# Patient Record
Sex: Female | Born: 1975 | Race: White | Hispanic: No | Marital: Single
Health system: Northeastern US, Academic
[De-identification: ages and names within clinical notes are randomized; demographics above are authoritative.]

---

## 2021-03-19 ENCOUNTER — Ambulatory Visit: Admit: 2021-03-19 | Payer: PRIVATE HEALTH INSURANCE | Attending: Registered"

## 2021-03-19 ENCOUNTER — Encounter: Admit: 2021-03-19 | Payer: PRIVATE HEALTH INSURANCE

## 2021-03-20 ENCOUNTER — Encounter: Admit: 2021-03-20 | Payer: PRIVATE HEALTH INSURANCE

## 2021-03-20 DIAGNOSIS — R140 Abdominal distension (gaseous): Secondary | ICD-10-CM

## 2021-03-20 NOTE — Progress Notes
Initial Nutrition Consultation 	VIDEO TELEHEALTH VISIT: This clinician is part of the telehealth program and is conducting this visit in a currently approved location. For this visit the clinician and patient were present via interactive audio & video telecommunications system that permits real-time communications.Patient/parent or guardian consent given for video visit: Yes State patient is located in: [place] clinician is appropriately licensed in the above state to provide care for this visit. Other individuals present during the telehealth encounter and their role/relation: noneIf billing based on time, please complete (Not required if billing based on MDM):                           Total time spent in medical video consultation: 60; Total time spent by the provider on the day of service, which includes time spent on chart review, medical video consultation, education, coordination of care/services and counseling Because this visit was completed over video, a hands-on physical exam was not performed.  Patient/parent or guardian understands and knows to call back if condition changes.Name: Kim Stephenson of Visit: 4/25/2022Provider:   Larita Fife, RDLocation of Visit:  Video;Referred by:  Dr. Joetta Manners (scanned to media) Date of referral: 3/14/22Reason(s) for Referral:   Bloating R14.0Client HistoryPast Medical History:No past medical history on file.States she has high cholesterol; states she has rosacea; Review of referral- hx negative celiac; SIBO, IBSSocial/Personal History including influence of family/friends on diet, supports systems any religious or cultural influences on diet:  Assessed.  Any notable comments as follows:  Self shop/ccooks; 88 year old daughter; son is in Electronics engineer; Physical Activity: started the gym four months ago; was swimming but on pause; walking more; gym three times 30 minutes each time;Weight History and Weight Goal:Wt: No data found for Wt Pt's chart reviewed for nutritionally significant: labs, medical tests, clinical findings, food allergies, and medications (and food/nutrient interactions).  Additional notations as needed:?	Food Allergies/Intolerances:  Allergies-  ; intolerance- bread; redness from alcohol; spicy?	?	Nutritionally Significant Medications:  reviewed- atorvastatin, phentermine?	Nutrition Supplements: multiple vitamin; d3- 2000 mg; gnc ultra is name of multiple, b complex?	Nutrition Related Laboratory Findings: reviewedNutrition focused physical exam:  Well appearingClinical signs and symptoms:  Bloating improved with tx for SIBO; tingling in extremities; bowel movements - loose or some constipation;Urgency- occasionally; Anthropometrics- Weight:	60                                                                                      Height: 190 lb   In MD note 12/27/20; cited as 180 lb by pt today                                                                                                      BMI:  35.1IBW:  126 lb used for calculation purposes, correlate with bmi of 25                                                                                                                         Nutrition Prescription using ynhh outpatient standards based upon activity/age unless otherwise specifiedCalories:  1900 calories if moderate activity Basic healthy diet to meet RDAs for age & genderAssessment of Current Intake and Learning NeedsSummary of dietary intake based upon interview and diet recall as below:  Intake as reported is three meals, random snacks.  States bloating is worse when eating products like bread, pasta; uses plant based milks though does not cite lactose intolerance as reason; - all suggesting that FODMAP diet could be of value.Diet appears low in grains- selectively does this for weight regulation of low carbDoes not enjoy meal planning, cooking tasks and thus when she tried fodmap elimination in the past, found it too confusing and confining. 24-Hour/Typical Day Diet Recall  First Meal Second Meal Third Meal  Snacks Other/food frequency notes Bloating mild in morning;Protein shake- veggies, banana;Beach body protein powder or vita greens;Feels good afterward; Veggies only, fruitOr pizza in air fryer or chicken nuggets; Chicken nuggets- red face;  As described earlier for lunch or salad  Water Dairy- not much; occasionally pudding with almond milkCheese in salad sometimesFruit- 1-2 fruit a few times weekVeggies- highGrains- try to stay away; gluten free bread tried- eats less carbs for weight regulation;Protein- only chicken or beef; no fish or seafood;Cooking fats- extra virgin olive oil; Snack food- popcornSweets- dark chocolate Dove candy      Patient concerns, any pt expressed learning needs  and goals for consult:  Bloating; SIBO; Nutrition Diagnosis:Food and nutrition related knowledge deficit r/t insufficient personalized assessment and education, aeb pt uncertainty about food choices for digestive issues.Nutrition Interventions:Nutrition Education- Content and Application Comprehensive education included explaining purpose of nutrition education as needed, relationship of nutrition to health and disease, recommended modifications and focused on skill development for lifestyle change.-Discussed  food type, amount, and distribution of food  based upon nutrition rx, making recommendations for change based upon usual dietary intake.Nutrition Education:  Content and Application X if discussed  Optimal types of foods and overall diet quality X reviewed fodmap elimination  Portion sizes  Specific nutrients:  Meal/snack timing  Meal balance  Non-hunger eating  Skills Meal planning X extensive ideas for easy meals to employ                               Budgeting                               Grocery shopping x                              Dining  out                               Cooking methods x                              Label reading X avoid high fructose corn syrup, hidden garlic / onion natural flavorings                              Tracking x Other: X supplements- noted is b complex and pt did not have all supplements available.  Unclear if supplement has higher dose niacin, b3 as reports some flushing.   Other:    Physical Activity  Purpose of nutrition education Gabriel Earing that symptoms merit another try at fodmap elimination and then challenges.  Explained concept and as pt agreed to try, reviewed fodmap elimination protocol.  Coordination of Nutrition Care: none required todayPatient Goals to Achieve by Next F/u unless otherwise specified: Send RD list of supplements being usedMinimum of 90 oz water per day Start FODMAP elimination diet next weekKeep a food/symptom logEducation Materials Provided:fodmap materials and summary in mychartUnderstanding, Motivation, Readiness/Barriers to Change and Anticipated Ability to Meet Nutrition Goals.Understanding-engaged and verbalizes understanding of fodmap eliminationMotivations/Strengths-no strong supports; motivated to improve digestive healthChallenges/Barriers to change and anticipated ability to achieve nutrition goals-no barriers though of note is that Quinlee does not want to spend a lot of time on meal planning/cooking as it is not a task she enjoys Monitoring and Evaluation for Follow Up:Follow Up Time Frame:  1 monthWill monitor:  Weight, food and fluid/beverage intake, goal adherence, digestive system symptoms, level of knowledge, vitamin/mineral supplement use  Start/end time:  10:55 am to 11:55 am Electronically Signed by Larita Fife, RD, March 19, 2021

## 2021-04-26 ENCOUNTER — Encounter: Admit: 2021-04-26 | Payer: PRIVATE HEALTH INSURANCE | Attending: Registered"

## 2021-04-26 ENCOUNTER — Encounter: Admit: 2021-04-26 | Payer: MEDICAID | Attending: Registered"

## 2021-04-26 NOTE — Progress Notes
6/2/2022Pt did not connect for her planned 11:15 am appointment.  Called pt at 11:30 am and she had the time confused but she was ultimately not able to access zoom due to technical problems on her end.  Thus a visit was not completed and she was rescheduled.Notes that she has found some symptom relief with fodmap elimination diet. But has hard time doing diet as is different from that of her family.  For instance, makes breaded veggies for all and stated that making for self is too time consuming.  Also was confused about veggies and carbs stating broccoli had 30 gm carb whereas 1/2 cup is 5 gm carb.Very briefly suggested lower fodmap veggies of green beans and steamed vs breaded.Noted by pt is frustration with weight and agreed to address at follow up.Electronically Signed by Larita Fife, RD, April 26, 2021

## 2021-05-07 NOTE — Progress Notes
Pt did not arrive for visit.Electronically Signed by Larita Fife, RD, May 07, 2021

## 2021-05-16 ENCOUNTER — Ambulatory Visit: Admit: 2021-05-16 | Payer: MEDICAID | Attending: Registered"

## 2021-05-16 ENCOUNTER — Encounter: Admit: 2021-05-16 | Payer: PRIVATE HEALTH INSURANCE

## 2021-05-16 NOTE — Progress Notes
Return Nutrition Consultation 	VIDEO TELEHEALTH VISIT: This clinician is part of the telehealth program and is conducting this visit in a currently approved location. For this visit the clinician and patient were present via interactive audio & video telecommunications system that permits real-time communications.Patient/parent or guardian consent given for video visit: Yes State patient is located in: [place]The clinician is appropriately licensed in the above state to provide care for this visit. Other individuals present during the telehealth encounter and their role/relation: noneIf billing based on time, please complete (Not required if billing based on MDM):                           Total time spent in medical video consultation: 25; Total time spent by the provider on the day of service, which includes time spent on chart review, medical video consultation, education, coordination of care/services and counseling Because this visit was completed over video, a hands-on physical exam was not performed.  Patient/parent or guardian understands and knows to call back if condition changes.Name: Kim Stephenson of Visit: 6/22/2022Provider:   Larita Fife, RDLocation of Visit:  Video;Referred by:  Dr. Joetta Manners (scanned to media) Date of referral: 3/14/22Reason(s) for Referral:   Bloating R14.0Client HistoryPast Medical History:No past medical history on file.States she has high cholesterol; states she has rosacea; Review of referral- hx negative celiac; SIBO, IBSSocial/Personal History including influence of family/friends on diet, supports systems any religious or cultural influences on diet:  Assessed.  Any notable comments as follows:  Self shop/ccooks; 44 year old daughter; son is in army; 05/16/2021- plans on moving to [facility] over the summer;Physical Activity: started the gym four months ago; was swimming but on pause; walking more; gym three times 30 minutes each time;Weight History and Weight Goal:Wt: No data found for Wt Pt's chart reviewed for nutritionally significant: labs, medical tests, clinical findings, food allergies, and medications (and food/nutrient interactions).  Additional notations as needed:?	Food Allergies/Intolerances:  Allergies-  ; intolerance- bread; redness from alcohol; spicy; blackberries; ?	Spicy, cabbage- face gets red?	Nutritionally Significant Medications:  reviewed- atorvastatin, phentermine?	Nutrition Supplements: multiple vitamin; d3- 2000 mg; gnc ultra is name of multiple, b complex?	Nutrition Related Laboratory Findings: reviewedNutrition focused physical exam:  Well appearingClinical signs and symptoms:  Bloating improved with tx for SIBO; tingling in extremities; bowel movements - loose or some constipation;Urgency- occasionally;  Anthropometrics- Weight:	60                                                                                      Height: 190 lb   In MD note 12/27/20; cited as 180 lb by at initial consult; 05/16/2021- no updates  BMI:        35.1IBW:  126 lb used for calculation purposes, correlate with bmi of 25                                                                                                                         Nutrition Prescription using ynhh outpatient standards based upon activity/age unless otherwise specifiedCalories:  1900 calories if moderate activity Basic healthy diet to meet RDAs for age & genderAssessment of Current Intake and Learning NeedsInitial consult was 03/19/21.RD and pt agreed to trial of low fodmap diet/elimiantion diet given hx sibo and symptoms of IBSNotable is pt disliking meal planning; Diet recall difficult for pt as has no routine;Diet recall suggests intake of protein is lower than ideal.Feels she did not lose weight but also states that bloating influences her perceptions of her weight/body fat massDigestively states she feels better some of the time but cannot find any specific trends.  Notes foods in allergy list to be not tolerated.Discussion and limited diet recall suggest that she has not implemented the fodmap elimination or low fodmap diet consistently.  Has limited time as notes packing as [place] [facility] by end of July.24-Hour/Typical Day Diet Recall- initial visit and updates 05/16/2021  First Meal Second Meal Third Meal  Snacks Other/food frequency notes Bloating mild in morning;Protein shake- veggies, banana;Beach body protein powder or vita greens;Feels good afterward;6/22/2022ELEVATION shake  Veggies only, fruitOr pizza in air fryer or chicken nuggets; Chicken nuggets- red face; 05/16/2021 Random; sandwiches- regular bread As described earlier for lunch or salad6/22/2022Random again- no routine;  Water Dairy- not much; occasionally pudding with almond milkCheese in salad sometimesFruit- 1-2 fruit a few times weekVeggies- highGrains- try to stay away; gluten free bread tried- eats less carbs for weight regulation;Protein- only chicken or beef; no fish or seafood;Cooking fats- extra virgin olive oil; Snack food- popcornSweets- dark chocolate Dove candy      Patient concerns, any pt expressed learning needs  and goals for consult:  Bloating; SIBO; Nutrition Diagnosis:Food and nutrition related knowledge deficit r/t insufficient personalized assessment and education, aeb pt uncertainty about food choices for digestive issues.- ongoing Nutrition Interventions:Nutrition Education- Content and Application Comprehensive education included explaining purpose of nutrition education as needed, relationship of nutrition to health and disease, recommended modifications and focused on skill development for lifestyle change.-Discussed  food type, amount, and distribution of food  based upon nutrition rx, making recommendations for change based upon usual dietary intake.Nutrition Education:  Content and Application X if discussed 03/19/21 X if discussed 05/16/2021  Optimal types of foods and overall diet quality X reviewed fodmap elimination  X low fodmap food choices discussed again.  Easy to access, assemble ones highlighted easy proteins, fruit/veggie on low fodmap list, trying to use brown rice or potato and keeping processed foods to a minimum to simplify need to read complicated ingredient lists etc. Portion sizes   Specific nutrients:   Meal/snack timing   Meal balance  Non-hunger eating   Skills                                 Meal planning X extensive ideas for easy meals to employ                                Budgeting                                Grocery shopping x                               Dining out                                Cooking methods x                               Label reading X avoid high fructose corn syrup, hidden garlic / onion natural flavorings                               Tracking x X benefits  Other: X supplements- noted is b complex and pt did not have all supplements available.  Unclear if supplement has higher dose niacin, b3 as reports some flushing.   X did not send her supplements  Other:     Physical Activity   Purpose of nutrition education Gabriel Earing that symptoms merit another try at fodmap elimination and then challenges.  Explained concept and as pt agreed to try, reviewed fodmap elimination protocol.  X revised plan of action Coordination of Nutrition Care: none required todayPatient Goals to Achieve by Next F/u unless otherwise specified: From initial consultSend RD list of supplements being used- not sentMinimum of 90 oz water per day- not done Start FODMAP elimination diet next week- not done; reviewe low fodmapKeep a food/symptom log- not done6/22/2022Send RD list of supplementsKeep food and symptom journalReview the low fodmap diet being sent Education Materials Provided:Low fodmap diet and food/symptom journalReminder of goals Understanding, Motivation, Readiness/Barriers to Change and Anticipated Ability to Meet Nutrition Goals.Understanding- needs additional time to learn low fodamp dietMotivations/Strengths-no strong supports; motivated to improve digestive healthChallenges/Barriers to change and anticipated ability to achieve nutrition goals-time constraints, limits in interest for meal planning/prep Monitoring and Evaluation for Follow Up:Follow Up Time Frame:  1 monthWill monitor:  Weight, food and fluid/beverage intake, goal adherence, digestive system symptoms, level of knowledge, vitamin/mineral supplement use  Start/end time:  10:20 to 10:45 amElectronically Signed by Larita Fife, RD, May 16, 2021

## 2021-05-17 DIAGNOSIS — R140 Abdominal distension (gaseous): Secondary | ICD-10-CM

## 2021-06-12 ENCOUNTER — Ambulatory Visit: Admit: 2021-06-12 | Payer: MEDICAID | Attending: Registered"

## 2022-10-04 IMAGING — DX HIP BILATERAL WITH PELVIS 5 VIEWS
1 series · 5 of 5 positions shown · non-contrast
Comparison: None.

________________________________________________________________________________________________ 
HIP BILATERAL WITH PELVIS 5 VIEWS, 10/04/2022 [DATE]: 
CLINICAL INDICATION: Pain in the left hip, evaluate for sacroiliitis..

[Series 1: AP · U · 0.14mm/px · 5 of 5 slices shown]
[im 1/5]
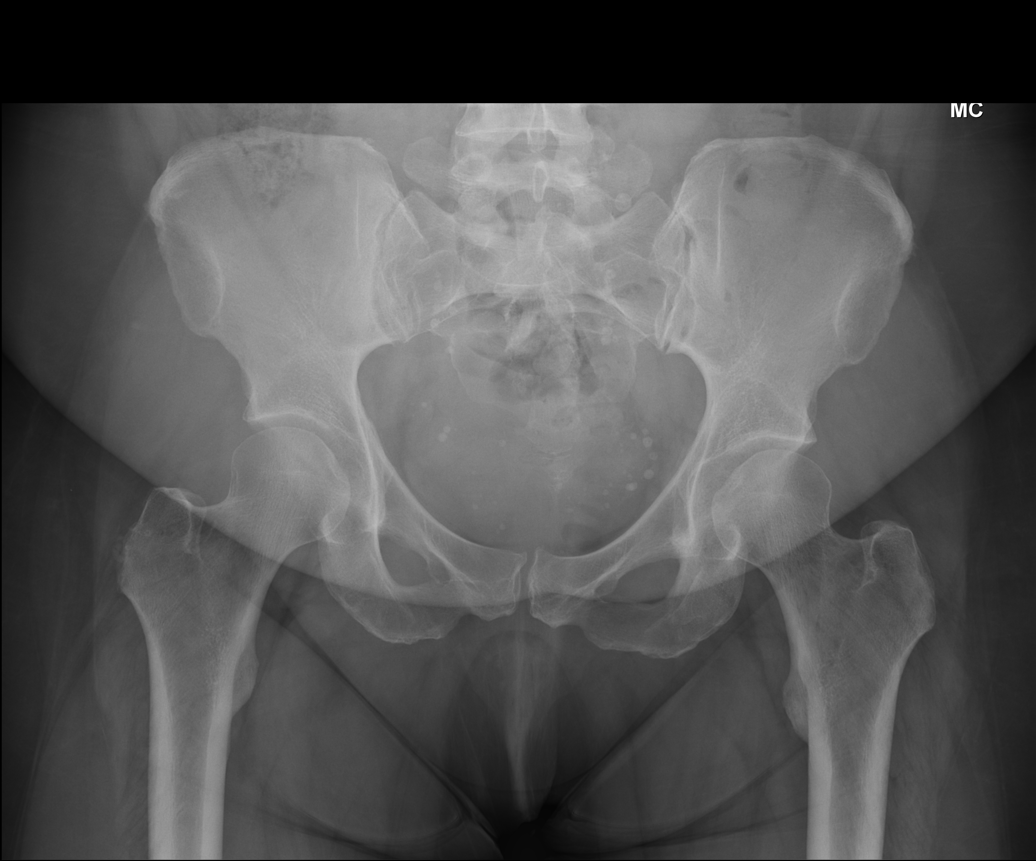
[im 2/5]
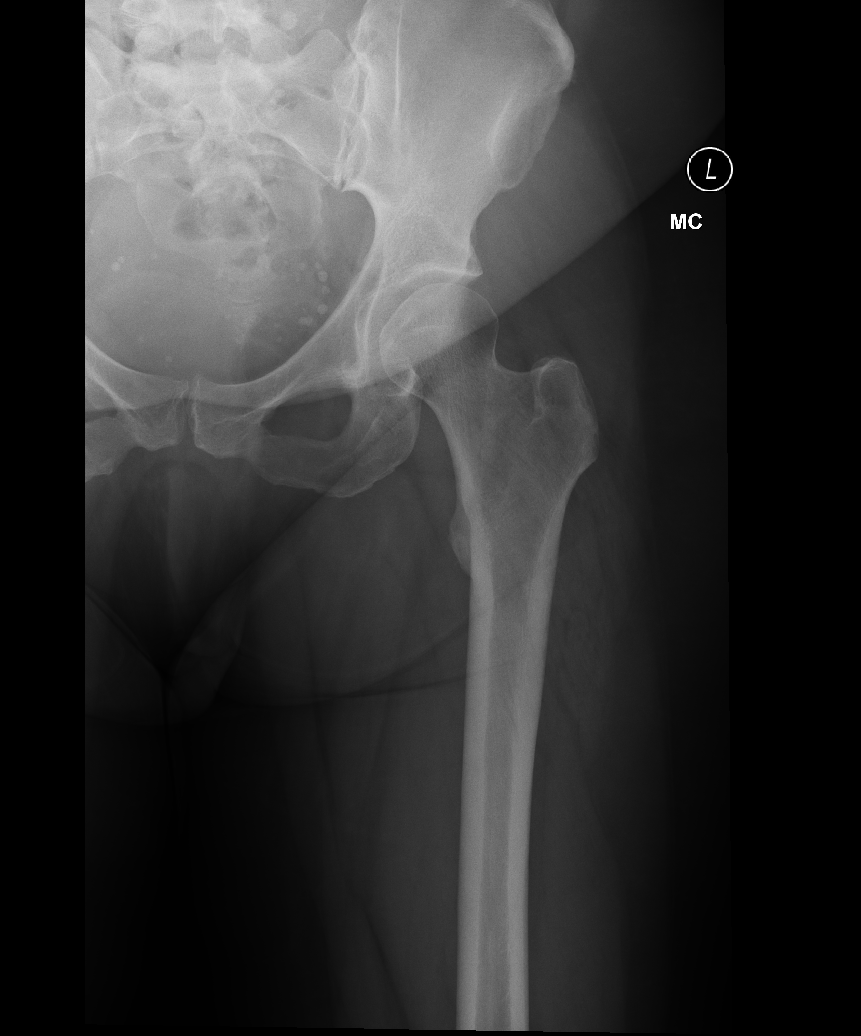
[im 3/5]
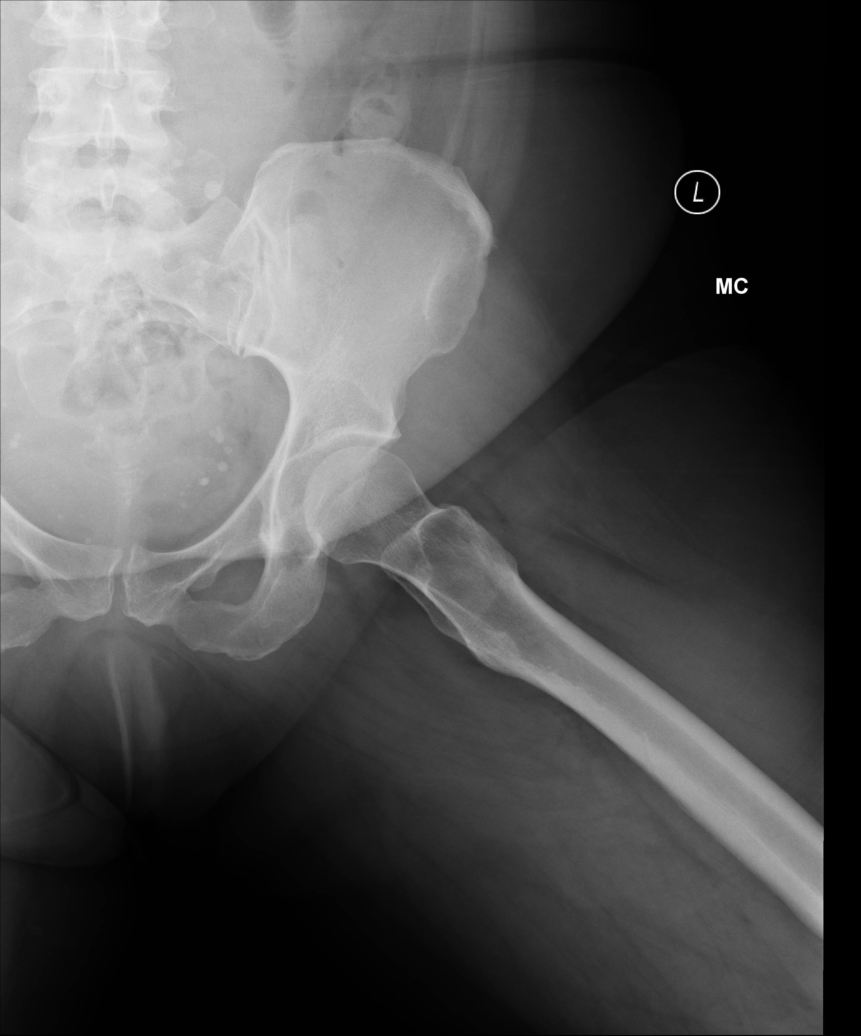
[im 4/5]
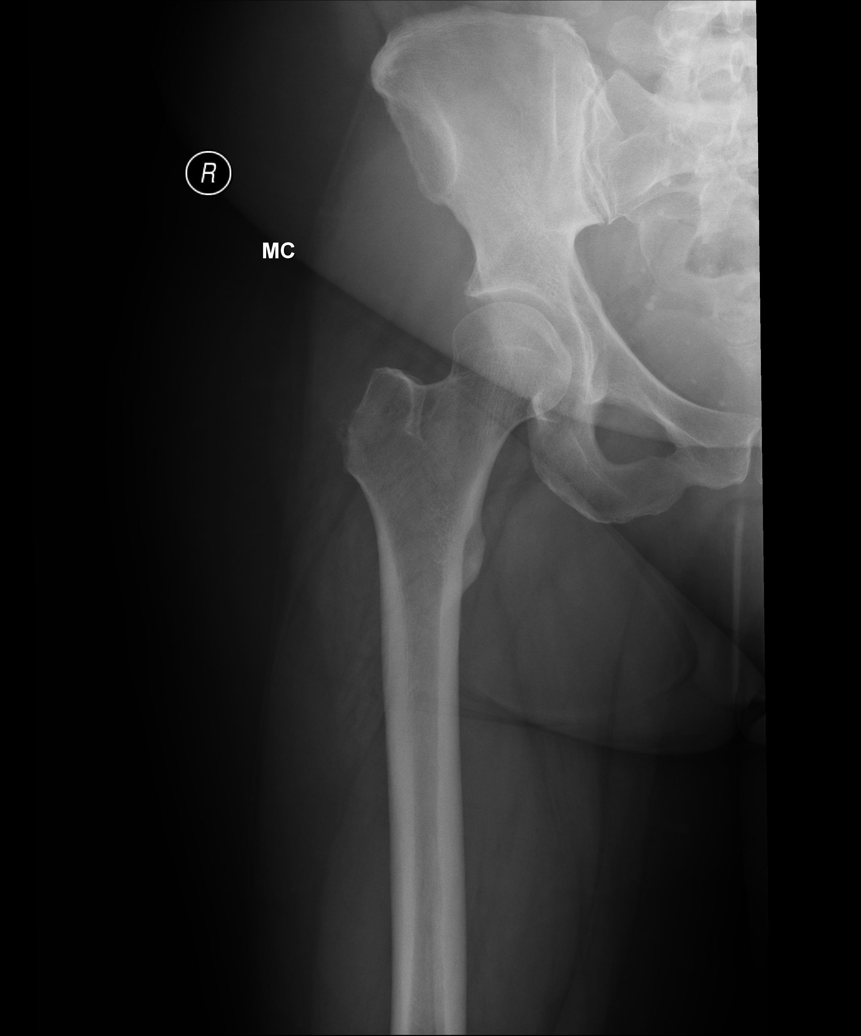
[im 5/5]
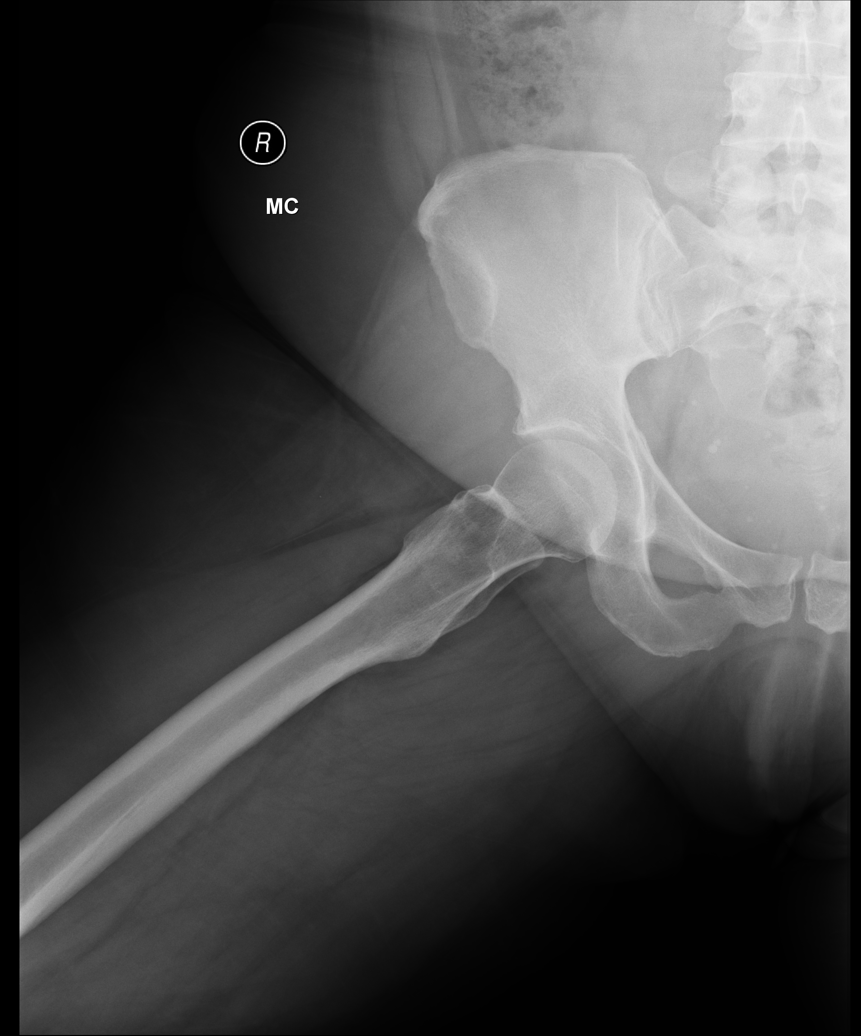

[5 of 5 positions shown; findings below may reference images not displayed]

FINDINGS: No fracture. Normal alignment. Hip joint spaces appear preserved. 
Pelvic phleboliths. There is an indeterminate calcification adjacent to the left 
L5 transverse process measuring 6 mm.
IMPRESSION: 1.  No acute osseous abnormality. 
2.  Indeterminate calculation adjacent to the left L5 transverse process. 
Consideration could be made for CT exam to exclude ureteral stone.

## 2023-03-05 IMAGING — CT CT PELVIS WITHOUT CONTRAST
2 of 3 series · 15 of 46 positions shown, 17 images · non-contrast
Comparison: 09/03/2022 radiographs

________________________________________________________________________________________________ 
CT PELVIS WITHOUT CONTRAST, 03/05/2023 [DATE]: 
CLINICAL INDICATION:  Pain in hips 
A search for DICOM formatted images was conducted for prior CT imaging studies 
completed at a non-affiliated media free facility.
TECHNIQUE: The pelvis was scanned without contrast on a high-resolution CT 
scanner using dose reduction techniques. Routine MPR reconstructions were 
performed. Count of known CT and Cardiac Nuclear Medicine studies performed in 
the previous 12 months = 0.

[Series 2: axial · axial · 0.84mm/px · z∈[-258,-40]mm · 12 of 167 slices shown, 14 images]
[im 11/167  soft-tissue]
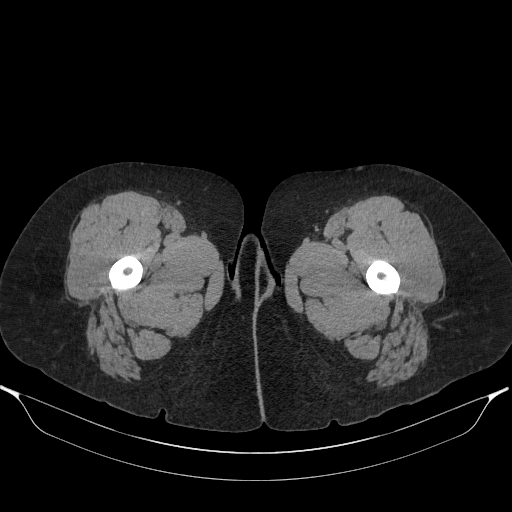
[im 11/167  bone]
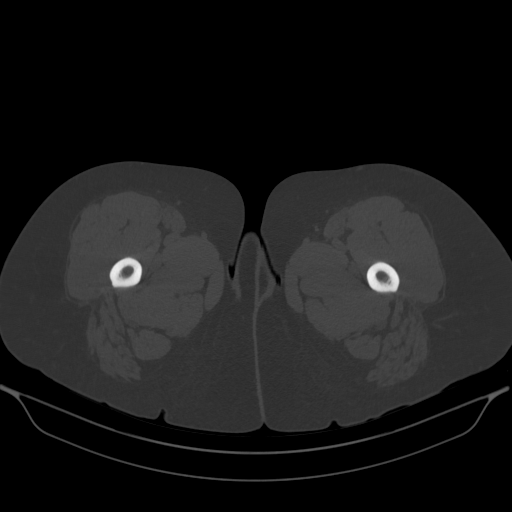
[im 22/167  soft-tissue]
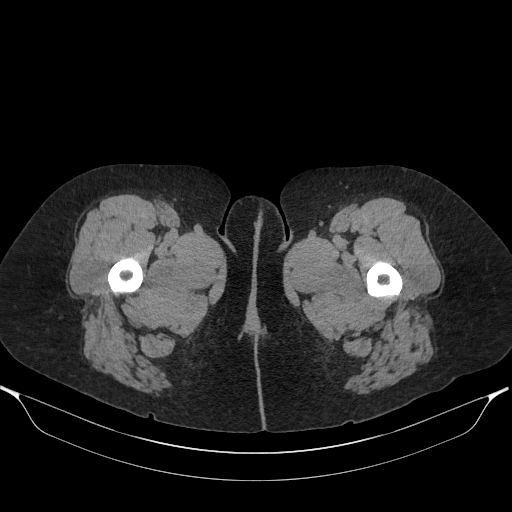
[im 38/167  soft-tissue]
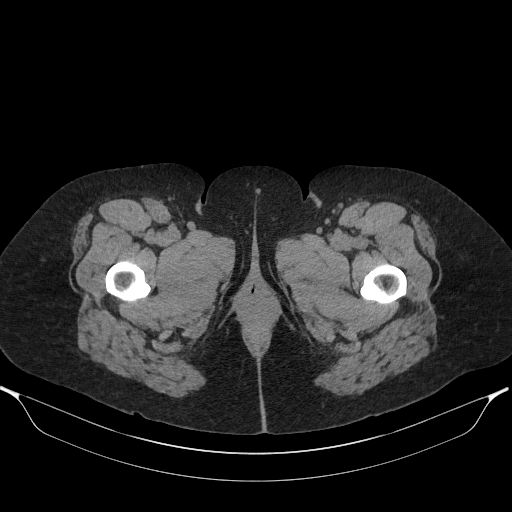
[im 49/167  soft-tissue]
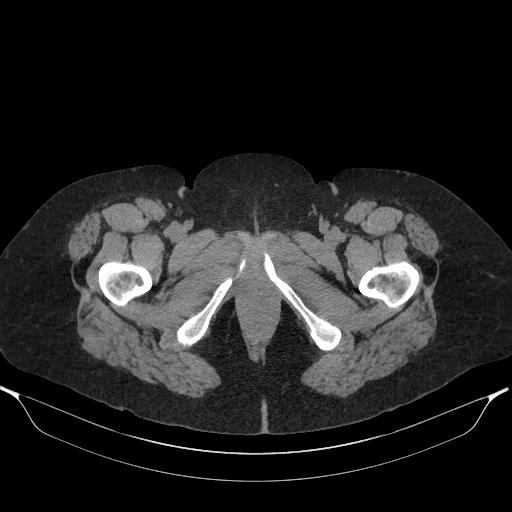
[im 65/167  soft-tissue]
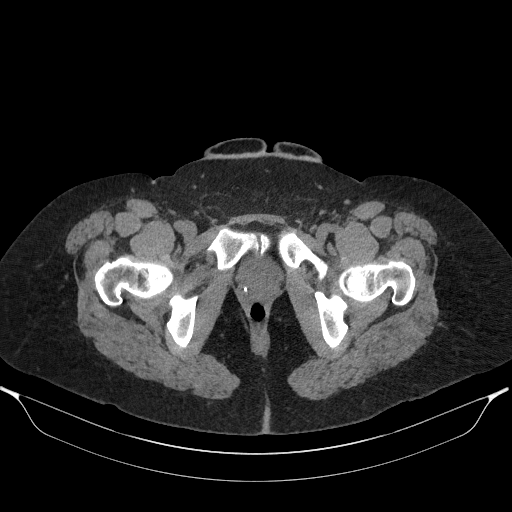
[im 75/167  soft-tissue]
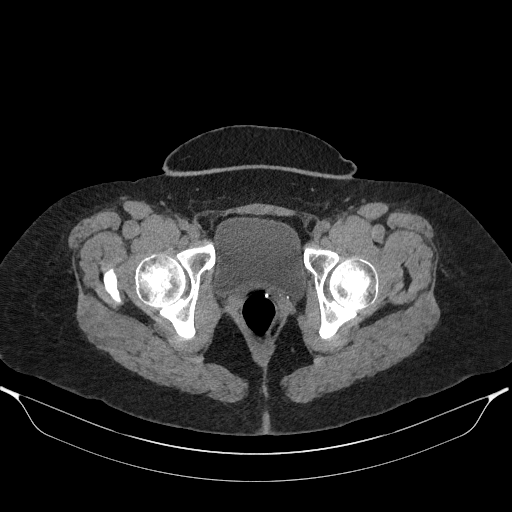
[im 92/167  soft-tissue]
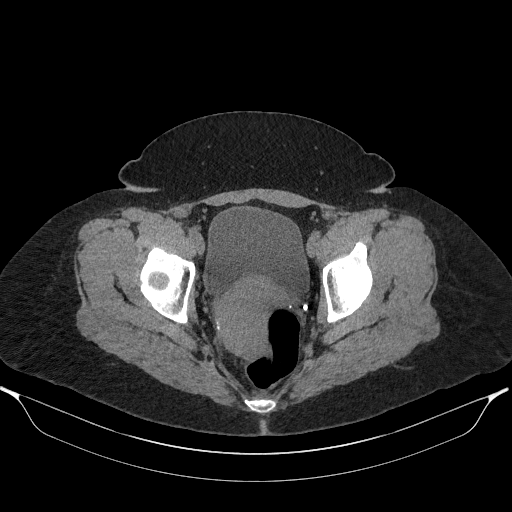
[im 102/167  soft-tissue]
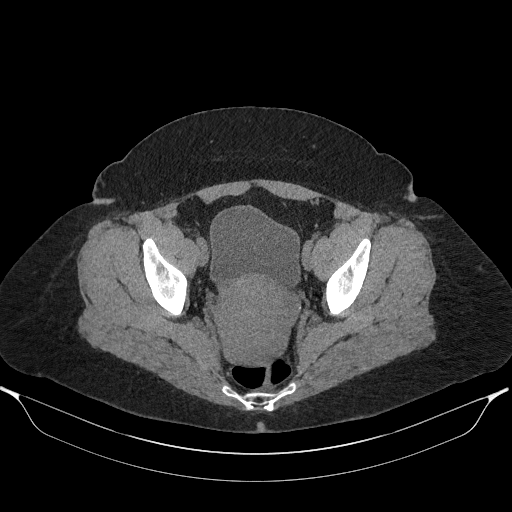
[im 118/167  soft-tissue]
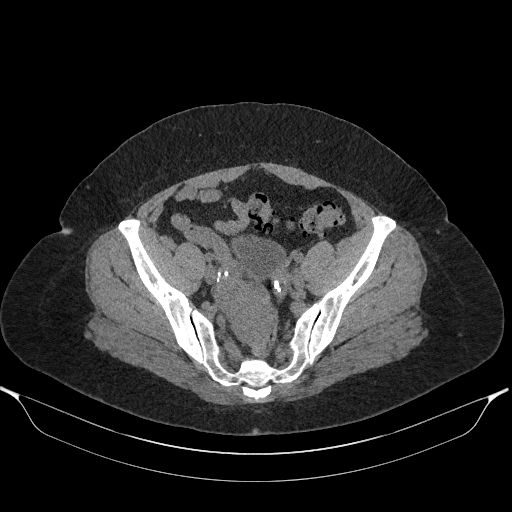
[im 118/167  bone]
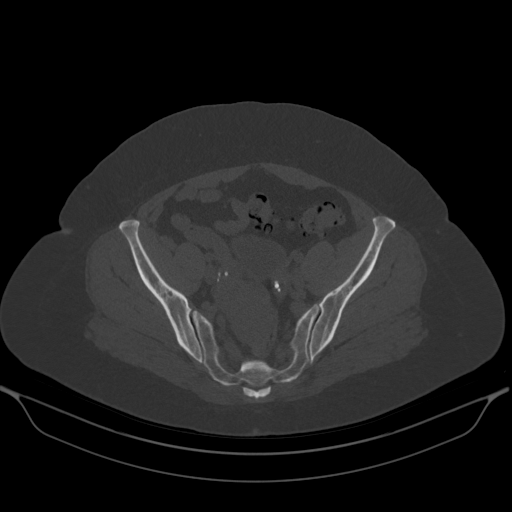
[im 129/167  soft-tissue]
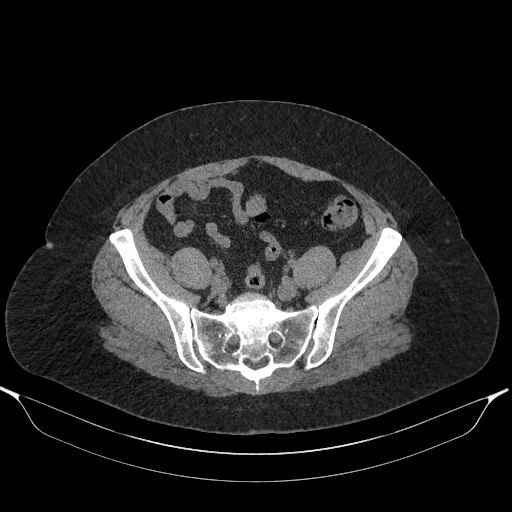
[im 145/167  soft-tissue]
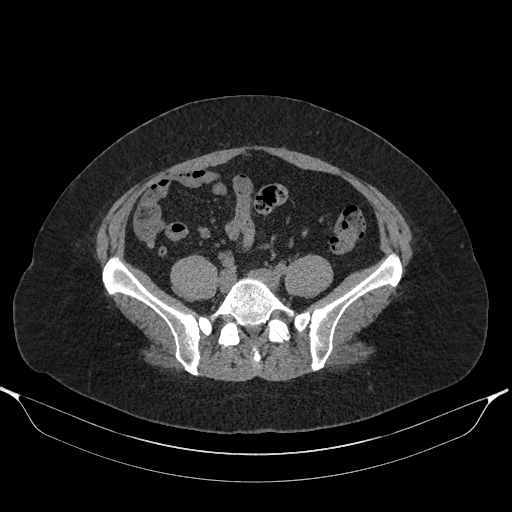
[im 156/167  soft-tissue]
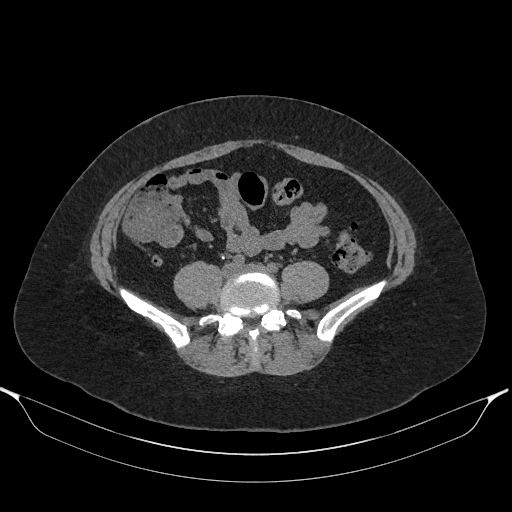

[Series 4: cor · coronal · 0.54mm/px · 3 of 139 slices shown]
[im 47/139  soft-tissue]
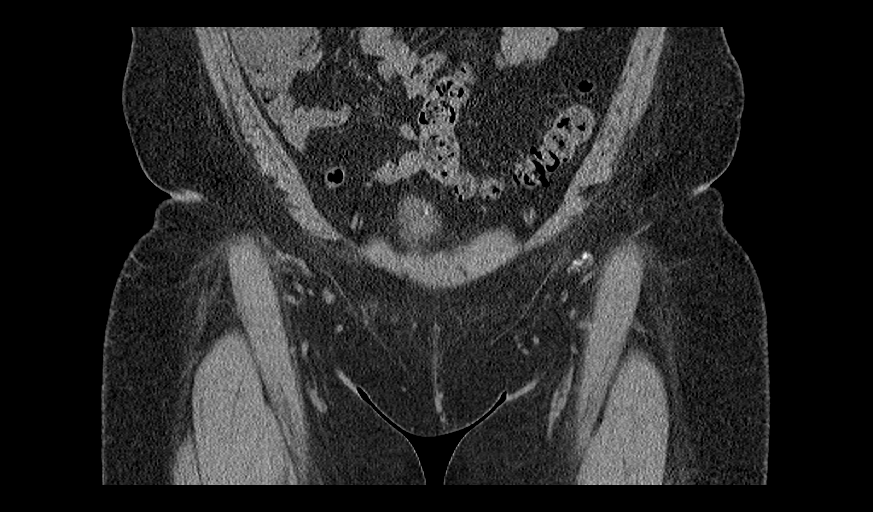
[im 62/139  soft-tissue]
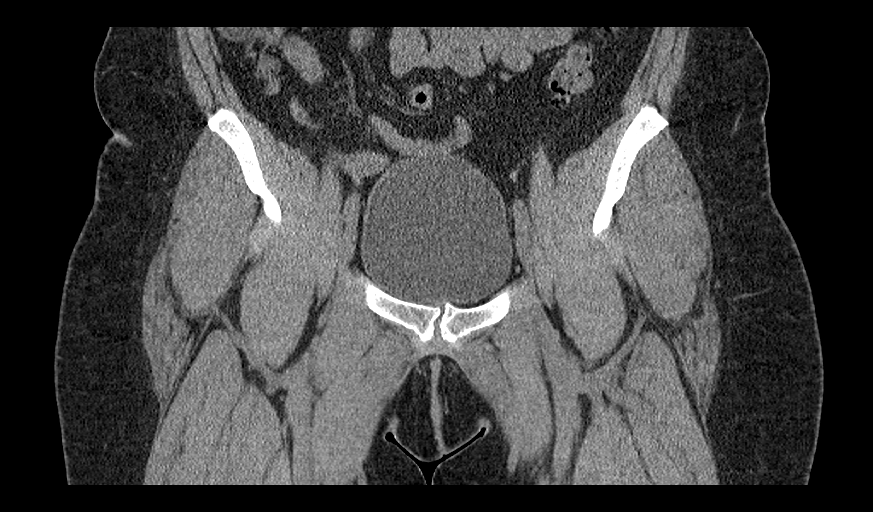
[im 77/139  soft-tissue]
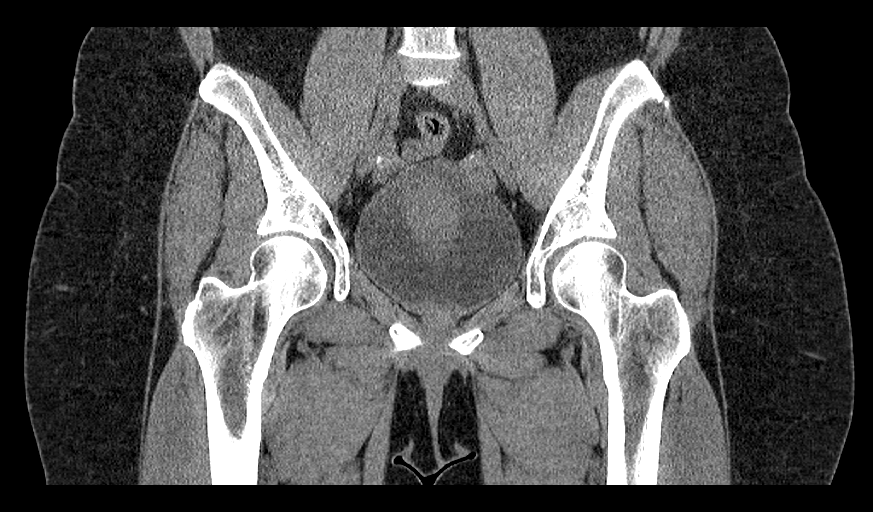

[15 of 46 positions shown; findings below may reference images not displayed]

FINDINGS: HIPS: Hip joints are preserved. Both femoral heads maintain a spherical 
configuration without evidence of avascular necrosis or subarticular collapse. 
No abnormal morphology of the proximal femurs or acetabulum to predispose to 
impingement. 
BONES: No fracture or marrow replacing lesion.  
SI JOINTS AND PUBIC SYMPHYSIS: Mild degenerative change. 
SPINE: Mild L5-S1 degenerative change. 
SOFT TISSUES: Minimal right distal gluteal calcific tendinosis. No mass or fluid 
collection. Retroverted uterus. Colonic diverticulosis. Normal appendix. 
Unenhanced bladder is unremarkable. No adenopathy or free fluid. Stable 
bilateral pelvic phleboliths, with the largest measuring 0.6 cm on the left 
(corresponding to radiographs).
IMPRESSION: 1.  Hip joints are preserved. 
2.  Minimal right distal gluteal calcific tendinosis.  
3.  Mild degenerative change of the pubic symphysis/SI joints/spine. 
4.  Colonic diverticulosis.  
RADIATION DOSE REDUCTION: All CT scans are performed using radiation dose 
reduction techniques, when applicable.  Technical factors are evaluated and 
adjusted to ensure appropriate moderation of exposure.  Automated dose 
management technology is applied to adjust the radiation doses to minimize 
exposure while achieving diagnostic quality images.

## 2023-03-05 IMAGING — CT CT CALCIUM SCORING
1 series · 15 of 20 positions shown, 19 images · non-contrast
Comparison: There are no previous exams available for comparison.

________________________________________________________________________________________________ 
CT CALCIUM SCORING, 03/05/2023 [DATE]:
INDICATION: Peripheral Vascular Disease, Unspecified

[Series 2: calscore · axial · 0.40mm/px · z∈[-245,-124]mm · 15 of 91 slices shown, 19 images]
[im 5/91  vessel]
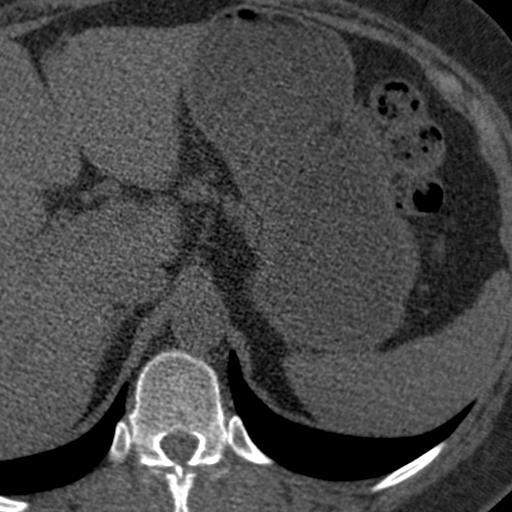
[im 5/91  lung]
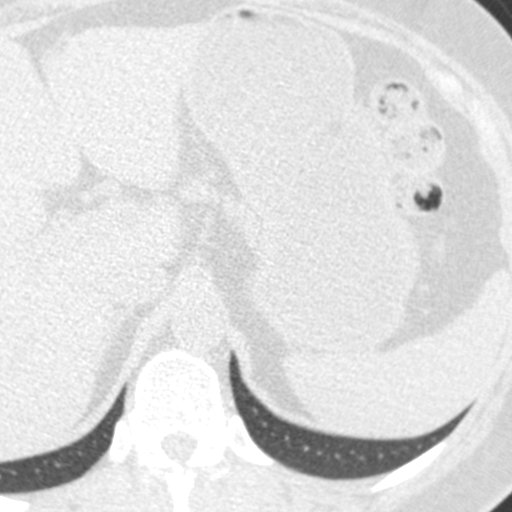
[im 10/91  vessel]
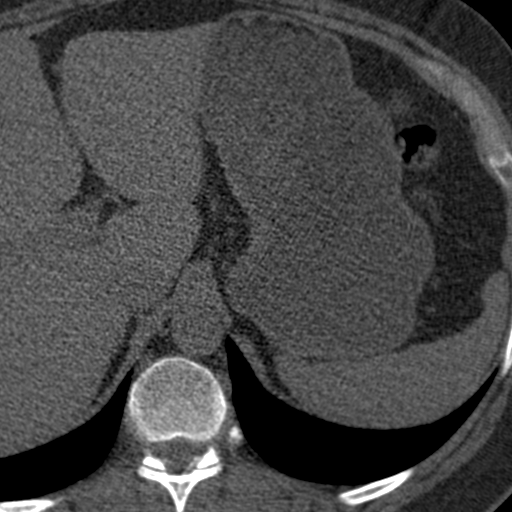
[im 19/91  vessel]
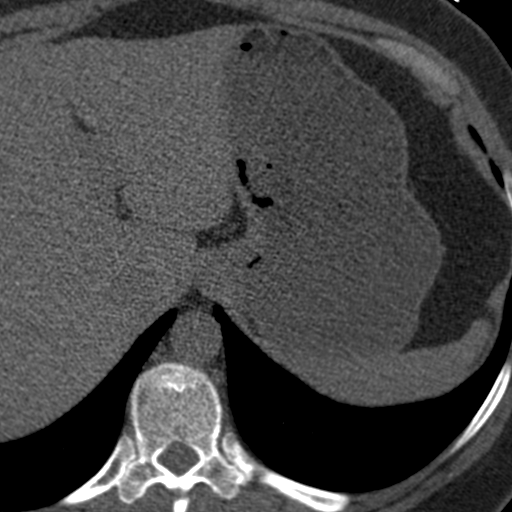
[im 24/91  vessel]
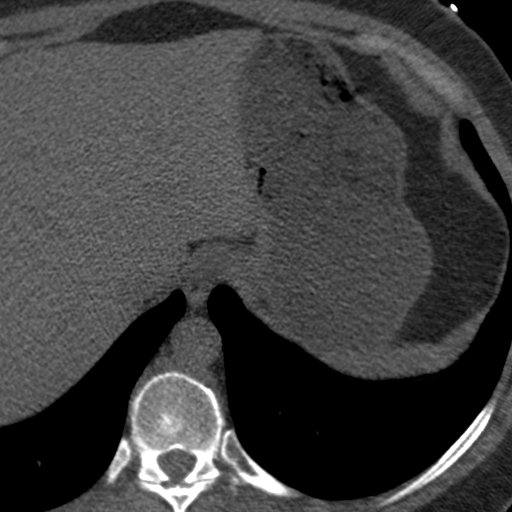
[im 29/91  vessel]
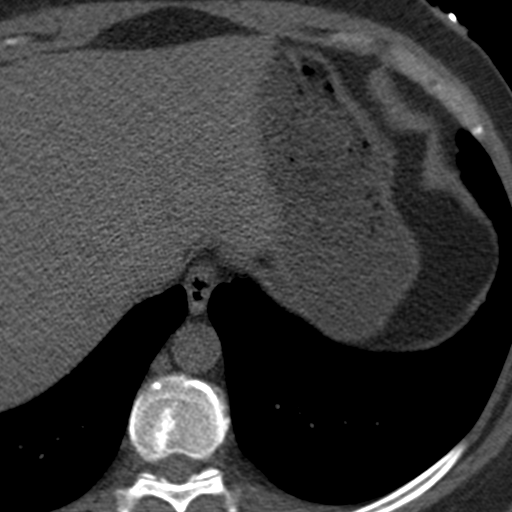
[im 29/91  lung]
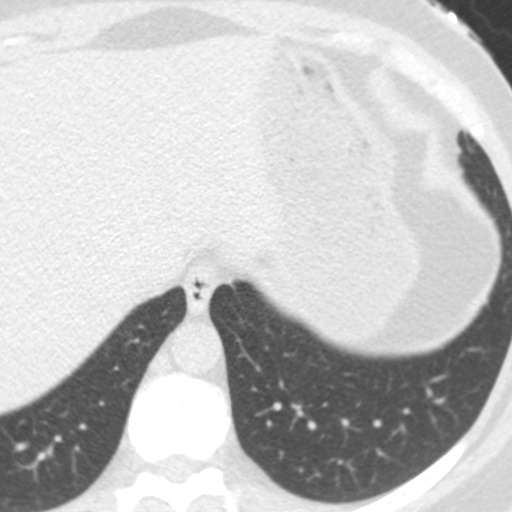
[im 34/91  vessel]
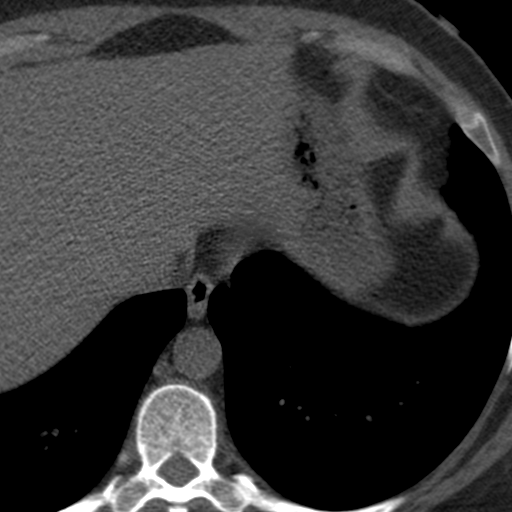
[im 38/91  vessel]
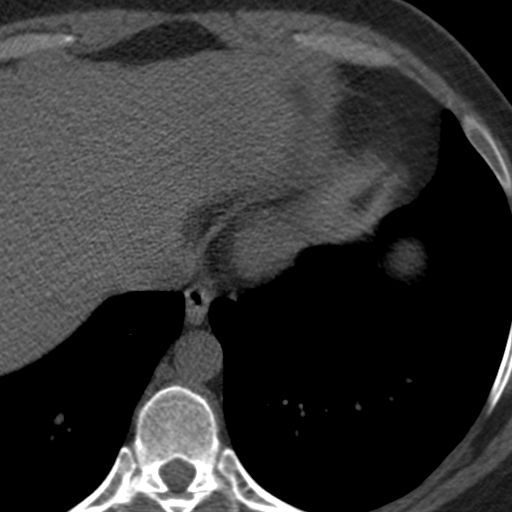
[im 48/91  vessel]
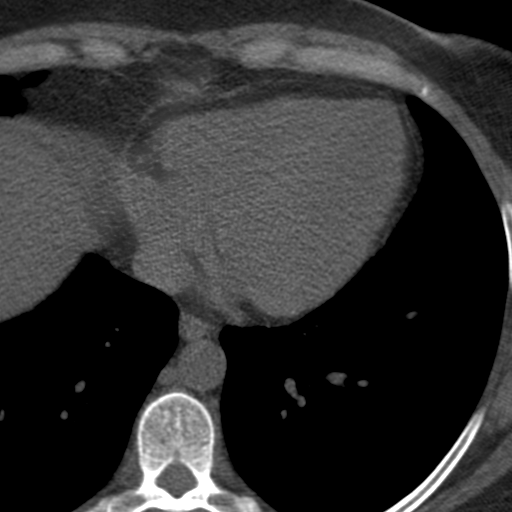
[im 53/91  vessel]
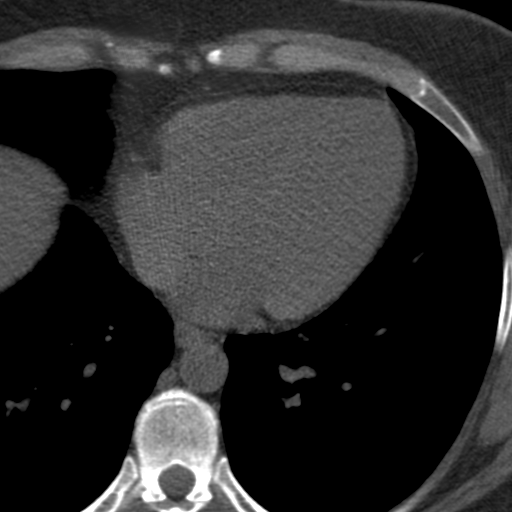
[im 53/91  lung]
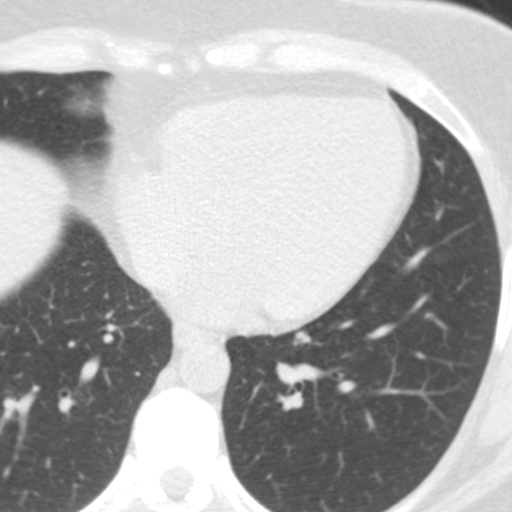
[im 57/91  vessel]
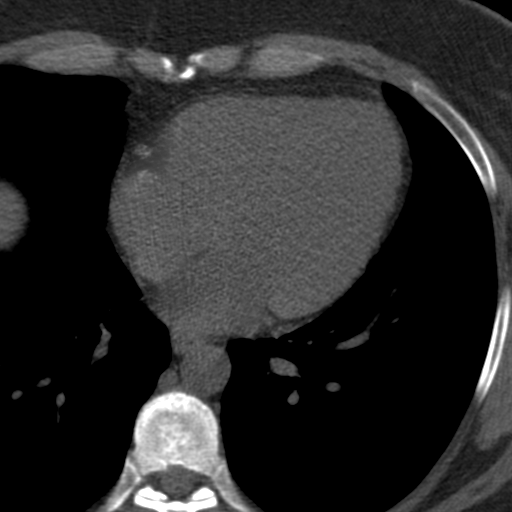
[im 62/91  vessel]
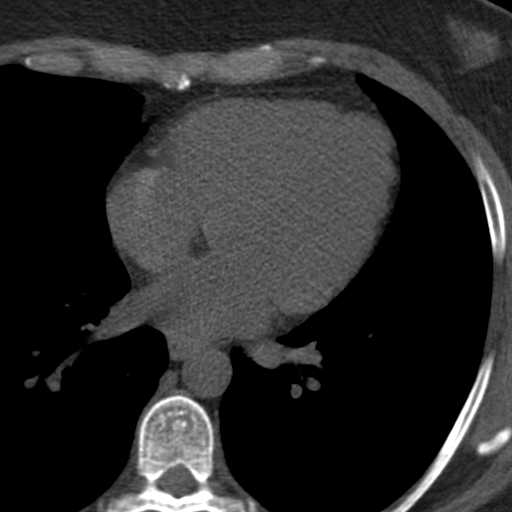
[im 67/91  vessel]
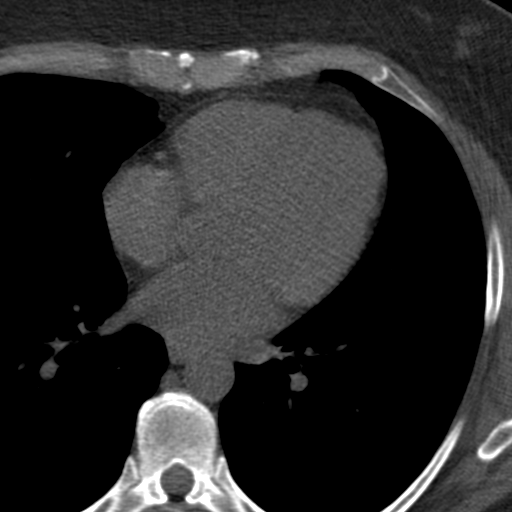
[im 76/91  vessel]
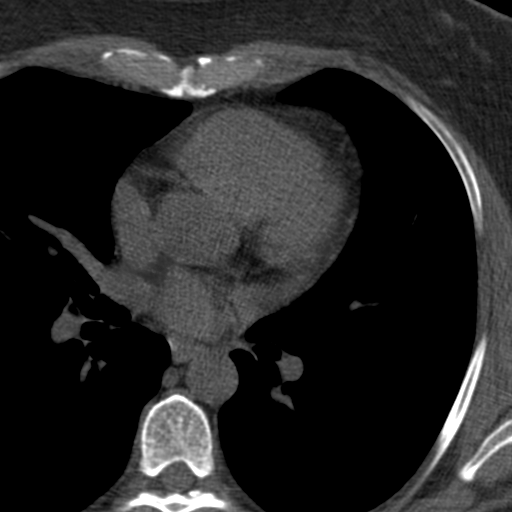
[im 76/91  lung]
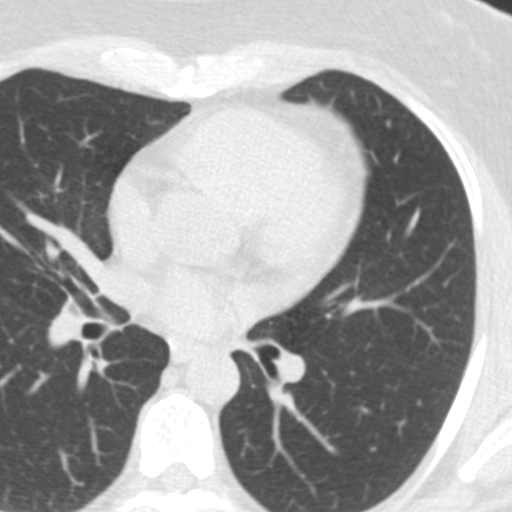
[im 81/91  vessel]
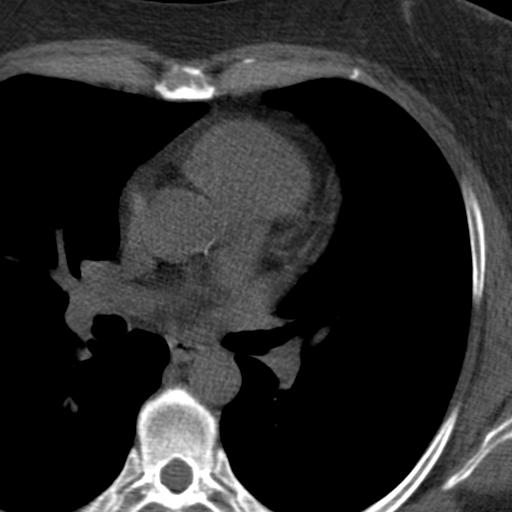
[im 86/91  vessel]
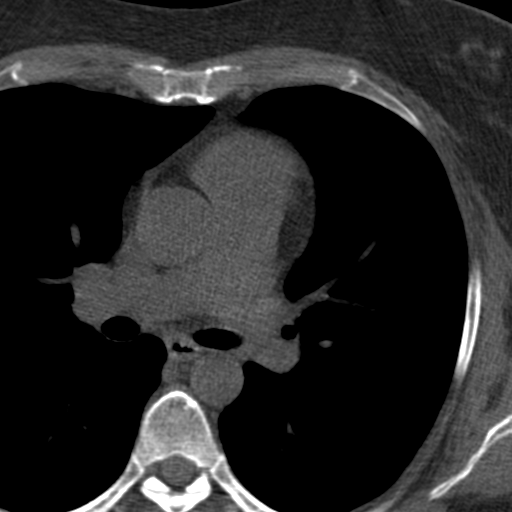

[15 of 20 positions shown; findings below may reference images not displayed]

Count of known 
CT and Cardiac Nuclear Medicine studies performed in the previous 12 months = 0.
FINDINGS: Visualized lungs are clear.
IMPRESSION: Left Main (LM) Score: 0 
Left Anterior Descending Artery (LAD) Score: 0 
Circumflex (CM) Score: 0 
Right Coronary Artery (RCA) Score: 0 
Posterior Descending Artery (PDA) Score: 0 
Total Calcium Score: 0 
Calcium score                                     Presence of Plaque 
0                                                    No evidence of plaque 
1-10                                               Minimal evidence of plaque 
11-100                                            Mild evidence of plaque 
101-400                                          Moderate evidence of plaque 
Over 400                                        Extensive evidence of plaque     

Consider further evaluation if multi-vessel or left-main predominant disease is 
present. 
Calcium scoring sheets to follow. 
RADIATION DOSE REDUCTION: All CT scans are performed using radiation dose 
reduction techniques, when applicable.  Technical factors are evaluated and 
adjusted to ensure appropriate moderation of exposure.  Automated dose 
management technology is applied to adjust the radiation doses to minimize 
exposure while achieving diagnostic quality images.

## 2023-03-15 IMAGING — MR MRI BRAIN WITHOUT CONTRAST
7 of 11 series · 23 of 48 positions shown · IV contrast (gadolinium)
Comparison: None

________________________________________________________________________________________________ 
MRI BRAIN WITHOUT CONTRAST, 03/15/2023 [DATE]: 
CLINICAL INDICATION: Short-term memory loss with vision and hearing changes over 
the past 6 months.
TECHNIQUE: Multiplanar, multiecho position MR images of the brain were performed 
without intravenous gadolinium enhancement. Patient was scanned on a
magnet.

[Series 102: mpr - smartbrain · axial · 1.1mm · 1.09mm/px · z∈[+0,+174]mm · 2 of 2 slices shown]
[im 1/2]
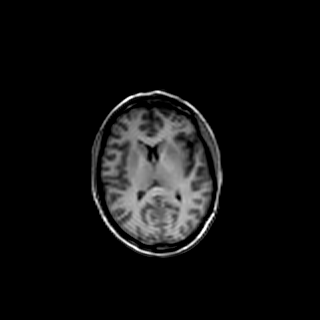
[im 2/2]
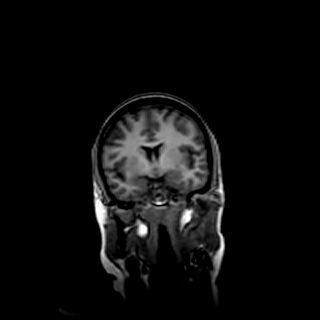

[Series 203: dadc map · axial · 5.0mm · 1.00mm/px · z∈[-102,+52]mm · 2 of 27 slices shown (1 of 2)]
[im 1/27]
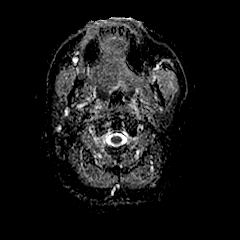
[im 27/27]
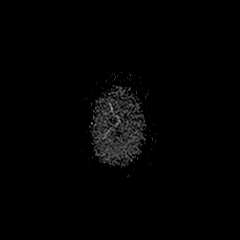

[Series 204: (id) · axial · 5.0mm · 1.00mm/px · z∈[-102,+52]mm · 2 of 27 slices shown (1 of 2)]
[im 1/27]
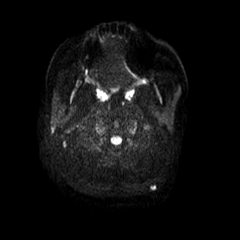
[im 27/27]
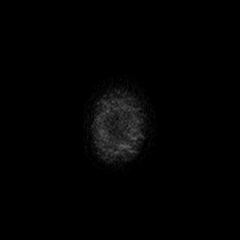

[Series 303: dadc map · coronal · 5.0mm · 0.81mm/px · 2 of 29 slices shown (2 of 2)]
[im 1/29]
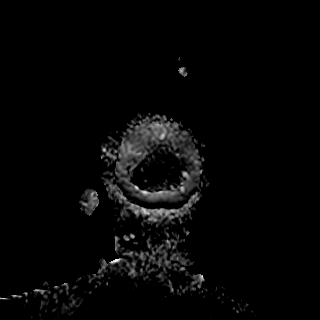
[im 29/29]
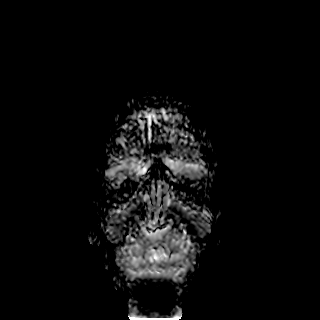

[Series 304: (id) · coronal · 5.0mm · 0.81mm/px · 2 of 29 slices shown (2 of 2)]
[im 1/29]
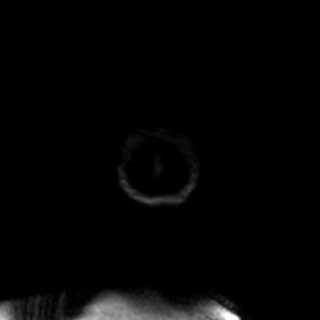
[im 29/29]
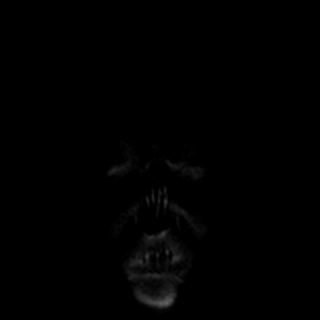

[Series 401: t1_se_sag · sagittal · 4.0mm · 0.43mm/px · 2 of 28 slices shown]
[im 1/28]
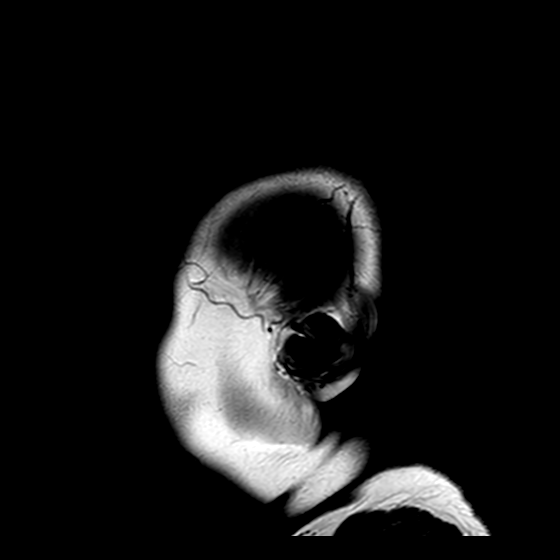
[im 28/28]
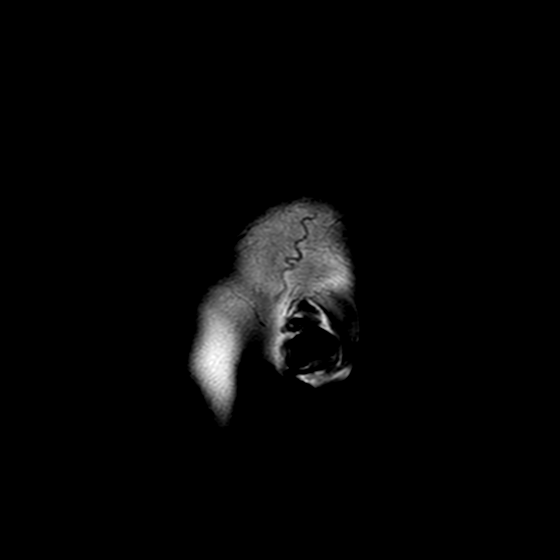

[Series 601: SWI · axial · 3.0mm · 0.40mm/px · z∈[-87,+42]mm · 11 of 200 slices shown]
[im 13/200]
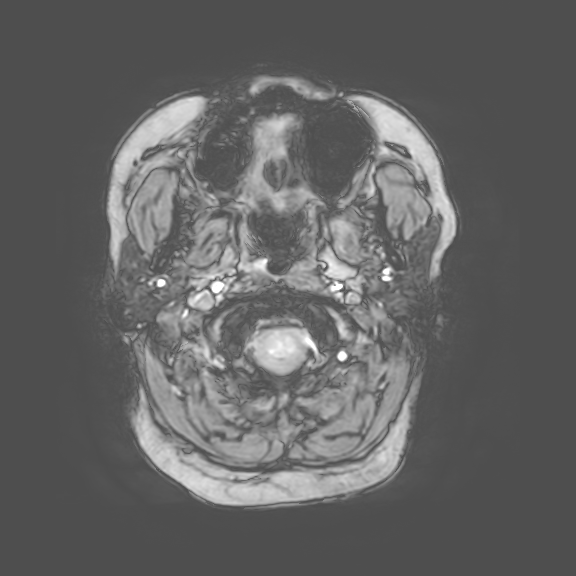
[im 25/200]
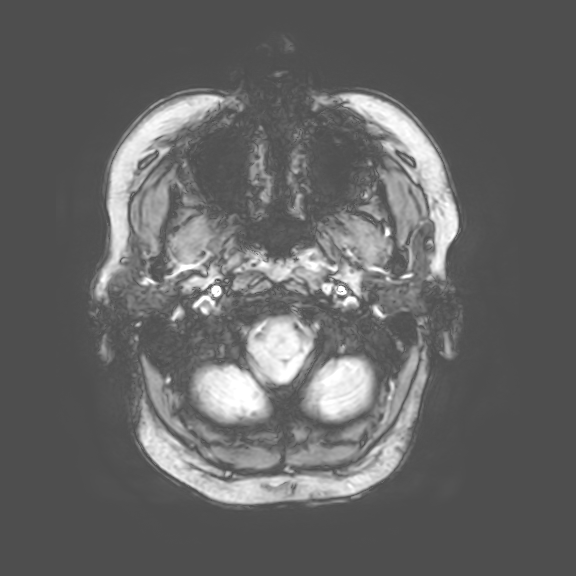
[im 38/200]
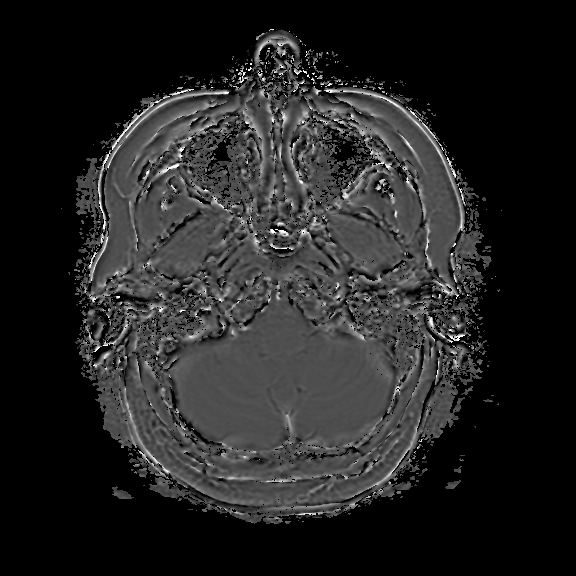
[im 63/200]
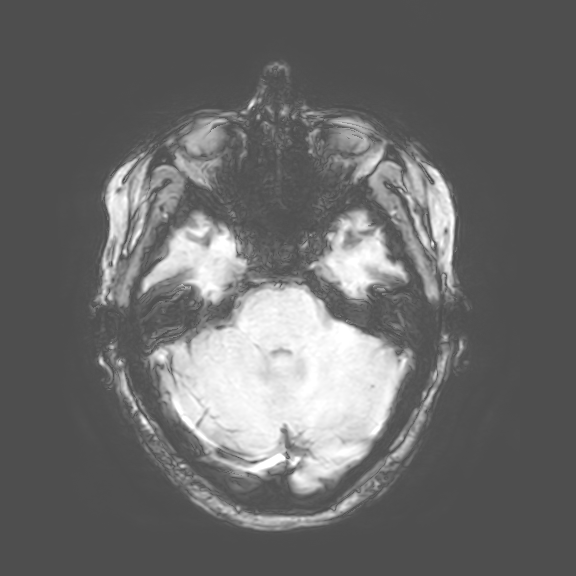
[im 88/200]
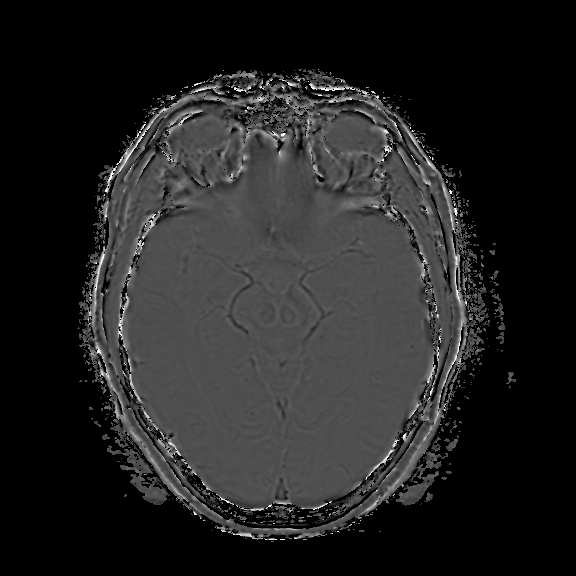
[im 100/200]
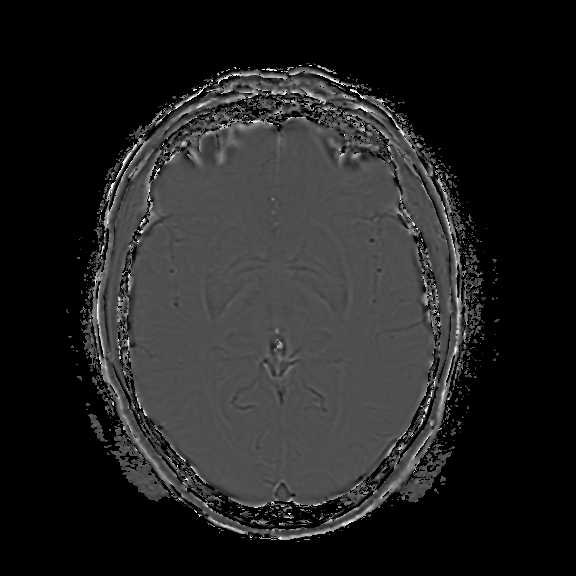
[im 112/200]
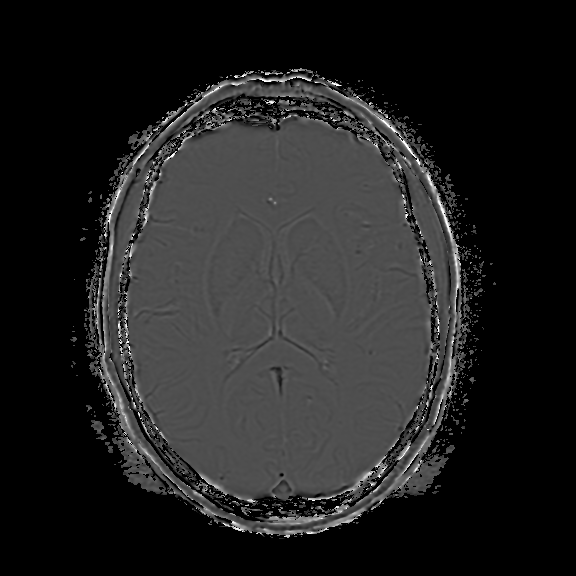
[im 137/200]
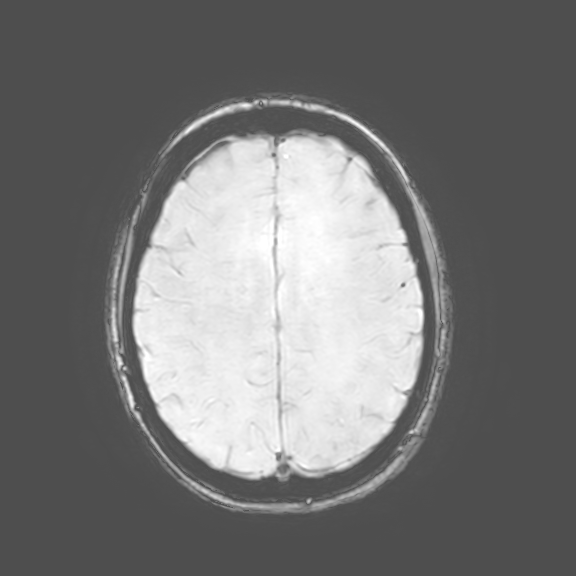
[im 162/200]
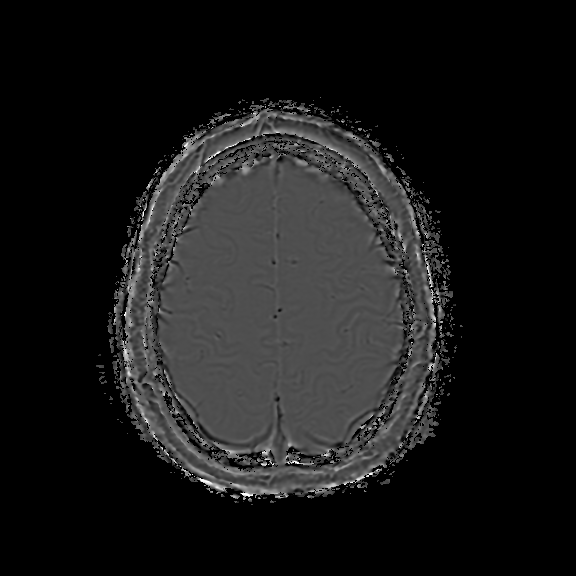
[im 175/200]
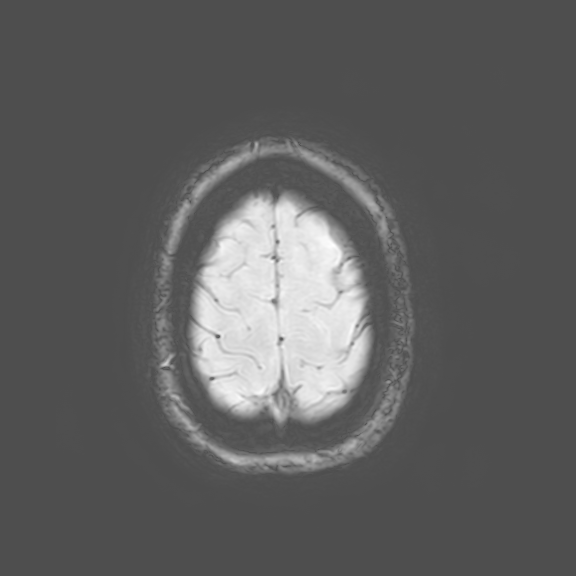
[im 187/200]
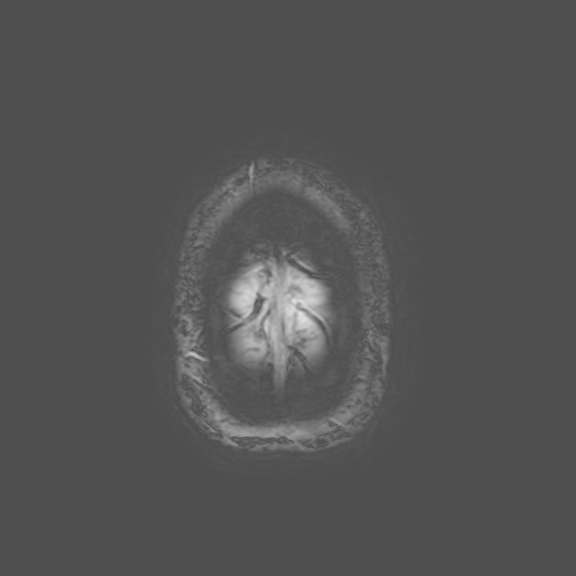

[23 of 48 positions shown; findings below may reference images not displayed]

FINDINGS: -------------------------------------------------------------------------------- 
------------------------- 
INTRACRANIAL: 
The cerebellar tonsils are minimally low-lying, residing at the foramen magnum 
but maintain their normal rounded configuration. 
No acute ischemia. No abnormal foci of susceptibility artifact in the brain. 
Patency of the intracranial vascular flow voids.  No acute intracranial 
hemorrhage, mass effect, midline shift. No large sellar mass. No hydrocephalus. 
Cerebral volume is age appropriate.  
-------------------------------------------------------------------------------- 
----------------------- 
OTHER: 
ORBITS/SINUSES/T-BONES:  Visualized orbits show no acute abnormality or mass.  
Mastoid air cells and middle ear cavities are grossly clear.  Visualized 
paranasal sinuses are clear. 
MARROW SIGNAL/SOFT TISSUES: No focal suspect signal abnormality.  
-------------------------------------------------------------------------------- 
-------------------
IMPRESSION: No acute intracranial process. Normal brain parenchymal signal and volume. 
Minimally low-lying cerebellar tonsils. However, they maintain a normal rounded 
configuration.

## 2023-03-15 IMAGING — MR MRI LUMBAR SPINE WITHOUT CONTRAST
7 of 8 series · 16 of 48 positions shown · IV contrast (gadolinium)
Comparison: CT pelvis March 05, 2023.

________________________________________________________________________________________________ 
MRI LUMBAR SPINE WITHOUT CONTRAST, 03/15/2023 [DATE]: 
CLINICAL INDICATION: Low back pain with left-sided hip and leg pain for 2 years.
TECHNIQUE: Multiplanar, multiecho position MR images of the lumbar spine were 
performed without intravenous gadolinium enhancement. Patient was scanned on a 
1.5T magnet.

[Series 101: survey · axial · 10.0mm · 1.25mm/px · 1 of 10 slices shown]
[im 1/10]
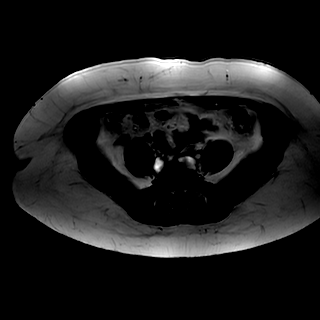

[Series 201: t2w_cor-surv · coronal · 6.0mm · 0.62mm/px · 1 of 10 slices shown]
[im 1/10]
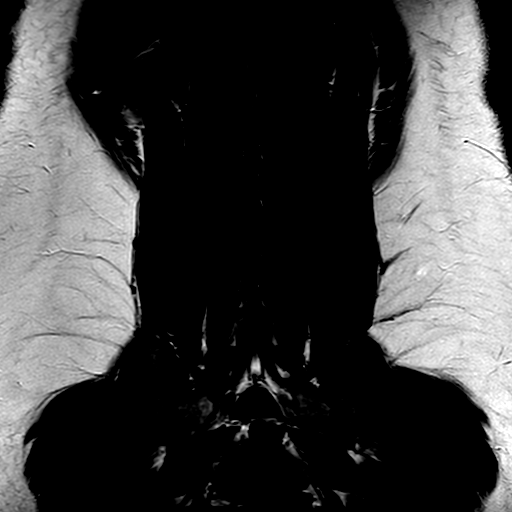

[Series 301: T1 · sagittal · 4.0mm · 0.46mm/px · 3 of 18 slices shown]
[im 1/18]
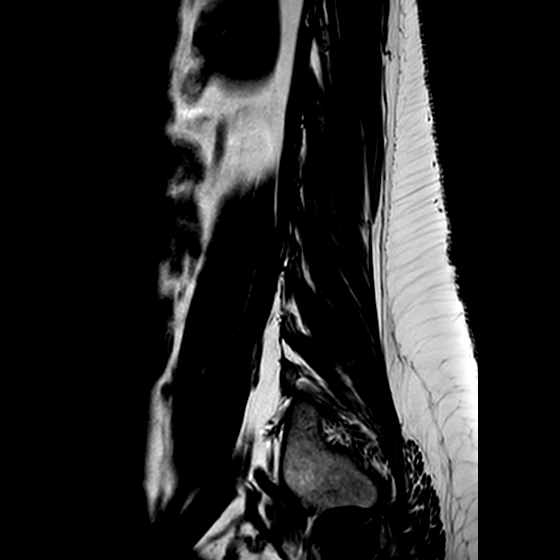
[im 9/18]
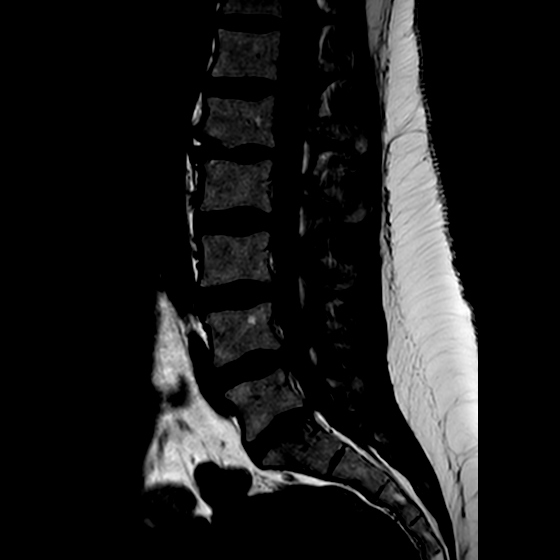
[im 18/18]
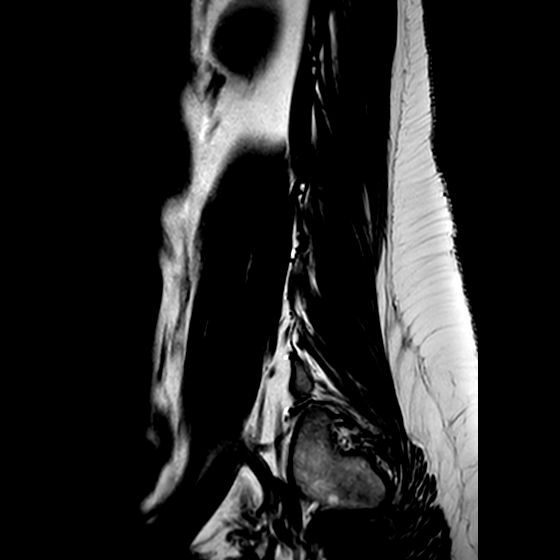

[Series 402: (id)_mdixon_tse · sagittal · 4.0mm · 0.50mm/px · 3 of 18 slices shown]
[im 1/18]
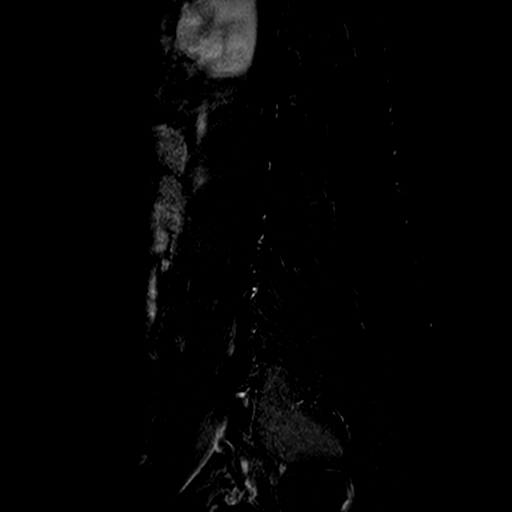
[im 9/18]
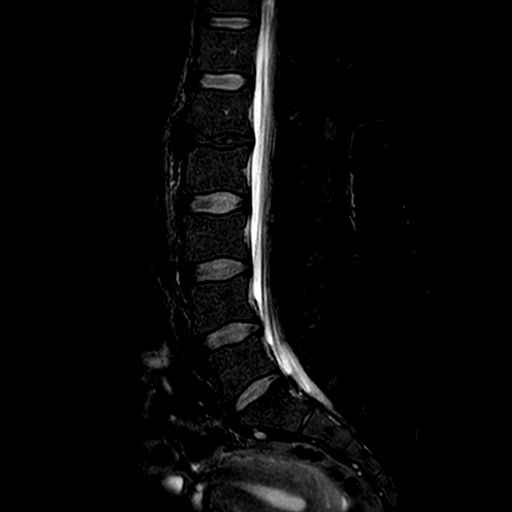
[im 18/18]
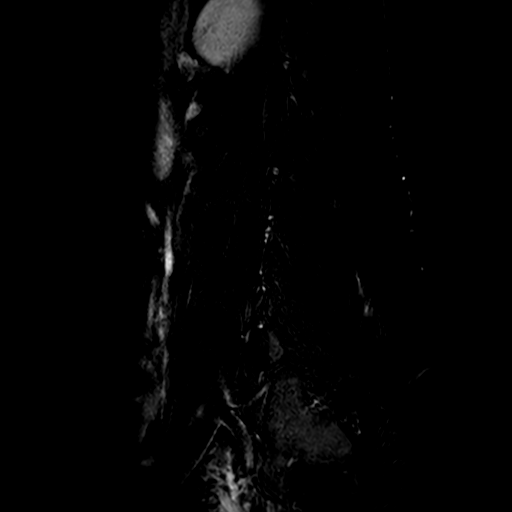

[Series 403: st2w_mdixon_tse · sagittal · 4.0mm · 0.50mm/px · 3 of 18 slices shown]
[im 1/18]
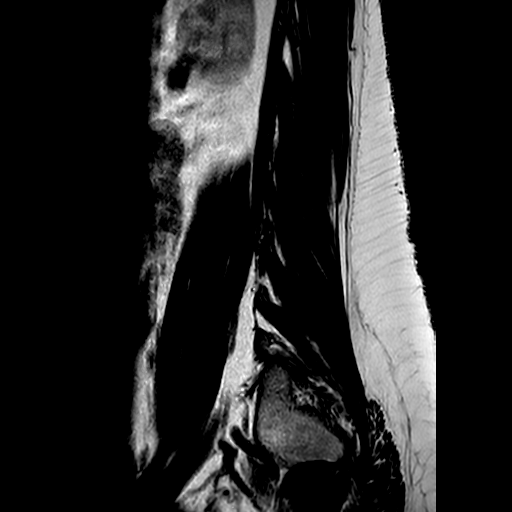
[im 9/18]
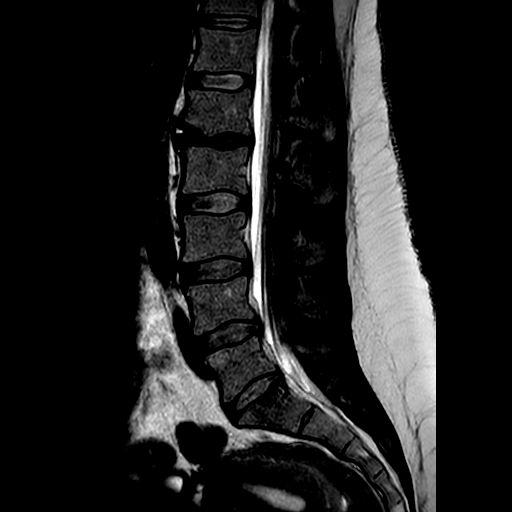
[im 18/18]
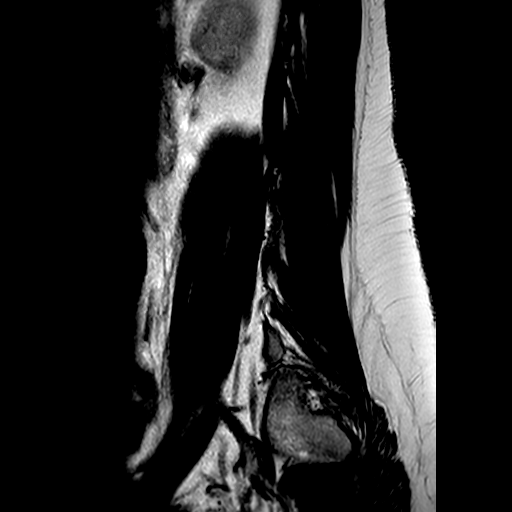

[Series 501: sag spineview ax · sagittal · 1.4mm · 0.33mm/px · 1 of 114 slices shown]
[im 8/114]
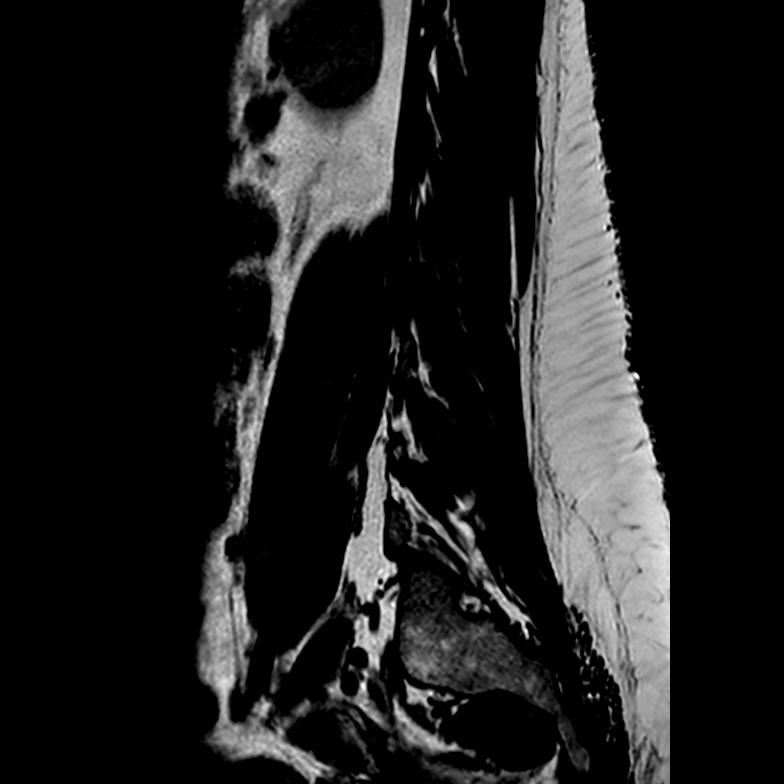

[Series 601: T2 · axial · 4.0mm · 0.30mm/px · z∈[-92,+126]mm · 4 of 30 slices shown]
[im 1/30]
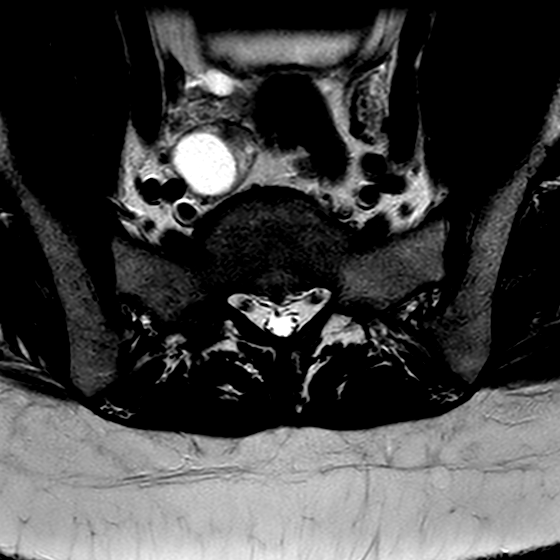
[im 10/30]
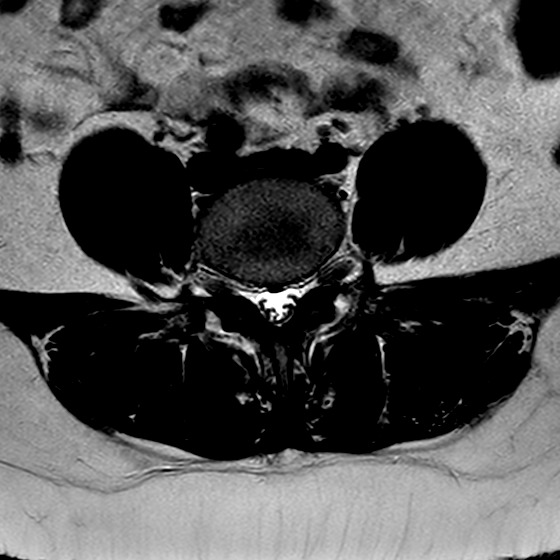
[im 20/30]
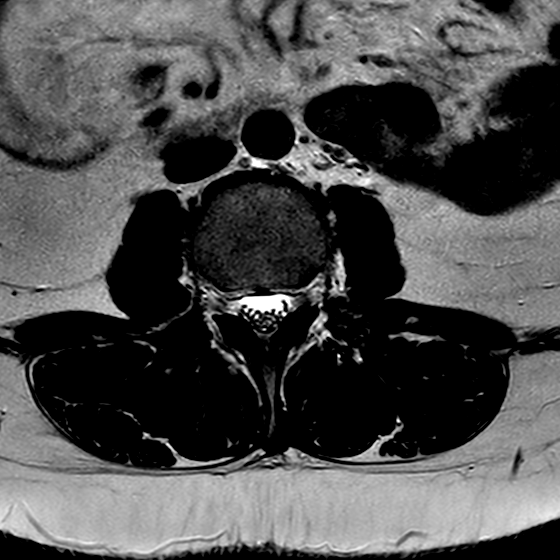
[im 30/30]
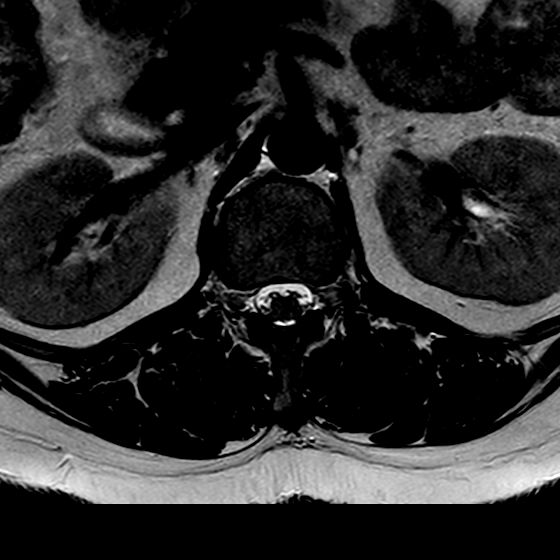

[16 of 48 positions shown; findings below may reference images not displayed]

FINDINGS: -------------------------------------------------------------------------------- 
------ 
GENERAL: 
Nomenclature is based on 5 lumbar type vertebral bodies.     
ALIGNMENT: Normal coronal alignment. Slight loss of the normal lumbar lordosis 
with straightening of the lumbar alignment. 
VERTEBRAL BODY HEIGHT: Normal. Limbus vertebrae L1. 
MARROW SIGNAL: No focal suspect signal abnormality. Tiny, subcentimeter 
hemangioma L4. 
CORD SIGNAL: Normal distal spinal cord and cauda equina. Conus medullaris 
terminates at L1. 
ADDITIONAL FINDINGS: Nabothian cysts noted in the cervix. The uterus is 
retroflexed. Fluid type signal within the endometrium measures up to 9 mm but 
could reflect changes related to the menstrual cycle. 18 mm follicle right 
adnexal region. 
Modic I-II: L1-L2. 
Ligamentum Flavum > 2.5 mm: All levels. 
-------------------------------------------------------------------------------- 
------ 
SEGMENTAL: 
T12-L1: Normal disc height and signal. No herniation. Normal facets. No spinal 
canal or neural foraminal stenosis. 
L1-L2: Moderate loss of disc height anteriorly. Loss of disc signal. Mild 
annular bulge with borderline canal stenosis. Foramina patent. Normal facets. 
L2-L3: Normal disc height and signal. Minimal annular bulge. Canal and foramina 
are patent. 
Small bilateral facet joint effusions. 
L3-L4: Slight loss of disc signal. Minimal annular bulge with borderline canal 
stenosis. Small bilateral facet joint effusions. Foramina are mildly narrowed 
bilaterally. 
L4-L5: Slight loss of disc signal. Mild annular bulge. Borderline canal 
stenosis. Minimal lateral recess narrowing bilaterally. Mild facet arthropathy. 
Mild bilateral foraminal narrowing. 
L5-S1: Loss of disc signal. Canal patent. Mild diffuse annular bulge with a tiny 
central disc protrusion. Mild bilateral foraminal narrowing. Normal facets. 
There is suggestion for an articulation between the right aspect of L5 and S1, 
but without marrow edema. This is incompletely visualized. 
-------------------------------------------------------------------------------- 
------
IMPRESSION: Mild lumbar degenerative changes without critical or significant central canal 
narrowing. 
Mild bilateral foraminal narrowing L3-L4, L4-L5 and L5-S1. 
There is suggestion for an articulation between the right aspect of L5 and S1, 
but without marrow edema. This can be a source of back pain, but is incompletely 
visualized.
